# Patient Record
Sex: Male | Born: 1990 | Race: White | Hispanic: No | Marital: Married | Smoking: Never smoker
Health system: Southern US, Community
[De-identification: ages and names within clinical notes are randomized; demographics above are authoritative.]

## PROBLEM LIST (undated history)

## (undated) DIAGNOSIS — T7840XA Allergy, unspecified, initial encounter: Secondary | ICD-10-CM

## (undated) HISTORY — DX: Allergy, unspecified, initial encounter: T78.40XA

## (undated) HISTORY — PX: FRACTURE SURGERY: SHX138

---
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
## 2017-06-13 ENCOUNTER — Emergency Department
Admission: EM | Admit: 2017-06-13 | Discharge: 2017-06-13 | Disposition: A | Discharge status: Home / Self Care | Payer: 59 | Attending: Emergency Medicine | Admitting: Emergency Medicine

## 2017-06-13 ENCOUNTER — Emergency Department: Payer: 59

## 2017-06-13 ENCOUNTER — Encounter: Payer: Self-pay | Admitting: Emergency Medicine

## 2017-06-13 DIAGNOSIS — W109XXA Fall (on) (from) unspecified stairs and steps, initial encounter: Secondary | ICD-10-CM | POA: Diagnosis not present

## 2017-06-13 DIAGNOSIS — Y929 Unspecified place or not applicable: Secondary | ICD-10-CM | POA: Insufficient documentation

## 2017-06-13 DIAGNOSIS — S43005A Unspecified dislocation of left shoulder joint, initial encounter: Secondary | ICD-10-CM | POA: Diagnosis not present

## 2017-06-13 DIAGNOSIS — S4992XA Unspecified injury of left shoulder and upper arm, initial encounter: Secondary | ICD-10-CM | POA: Diagnosis not present

## 2017-06-13 DIAGNOSIS — S43015A Anterior dislocation of left humerus, initial encounter: Secondary | ICD-10-CM | POA: Diagnosis not present

## 2017-06-13 DIAGNOSIS — Y999 Unspecified external cause status: Secondary | ICD-10-CM | POA: Insufficient documentation

## 2017-06-13 DIAGNOSIS — Y939 Activity, unspecified: Secondary | ICD-10-CM | POA: Diagnosis not present

## 2017-06-13 MED ORDER — ONDANSETRON HCL 4 MG/2ML IJ SOLN
INTRAMUSCULAR | Status: AC
  Filled 2017-06-13: qty 2

## 2017-06-13 MED ORDER — ONDANSETRON HCL 4 MG/2ML IJ SOLN
4.0000 mg | Freq: Once | INTRAMUSCULAR | Status: AC
  Administered 2017-06-13: 4 mg via INTRAVENOUS

## 2017-06-13 MED ORDER — SODIUM CHLORIDE 0.9 % IV BOLUS (SEPSIS)
1000.0000 mL | Freq: Once | INTRAVENOUS | Status: AC
  Administered 2017-06-13: 1000 mL via INTRAVENOUS

## 2017-06-13 MED ORDER — ETOMIDATE 2 MG/ML IV SOLN
15.0000 mg | Freq: Once | INTRAVENOUS | Status: DC
  Filled 2017-06-13: qty 10

## 2017-06-13 MED ORDER — ETOMIDATE 2 MG/ML IV SOLN
INTRAVENOUS | Status: AC | PRN
  Administered 2017-06-13: 12 mg via INTRAVENOUS

## 2017-06-13 MED ORDER — HYDROMORPHONE HCL 1 MG/ML IJ SOLN
1.0000 mg | Freq: Once | INTRAMUSCULAR | Status: AC
  Administered 2017-06-13: 1 mg via INTRAVENOUS
  Filled 2017-06-13: qty 1

## 2017-06-13 NOTE — ED Provider Notes (Signed)
[facility] Emergency Department Provider Note ____________________________________________   I have reviewed the triage vital signs and the triage nursing note.  HISTORY  Chief Complaint Shoulder Injury   Historian Patient  HPI Jorge Andrews is a 26 y.o. male left shoulder pain and deformity after fall.  Moderate pain, at least 6 out of 10.  Patient has had multiple dislocations of the right shoulder, followed by surgery.  This is the second time he is dislocated to the left shoulder.  Movement makes it worse.  History reviewed. No pertinent past medical history.  There are no active problems to display for this patient.   History reviewed. No pertinent surgical history.  Prior to Admission medications   Not on File    Allergies  Allergen Reactions  . Codeine Hives    No family history on file.  Social History Social History  Substance Use Topics  . Smoking status: Never Smoker  . Smokeless tobacco: Not on file  . Alcohol use Not on file    Review of Systems  Constitutional: Negative for fever. Eyes: Negative for visual changes. ENT: Negative for sore throat. Cardiovascular: Negative for chest pain. Respiratory: Negative for shortness of breath. Gastrointestinal: Negative for abdominal pain, vomiting and diarrhea. Genitourinary: Negative for dysuria. Musculoskeletal: Negative for back pain.  Positive for left shoulder pain. Skin: Negative for rash. Neurological: Negative for headache.  ____________________________________________   PHYSICAL EXAM:  VITAL SIGNS: ED Triage Vitals  Enc Vitals Group     BP 06/13/17 1110 131/82     Pulse Rate 06/13/17 1110 72     Resp 06/13/17 1110 20     Temp 06/13/17 1110 98.2 F (36.8 C)     Temp Source 06/13/17 1110 Oral     SpO2 06/13/17 1110 98 %     Weight 06/13/17 1111 250 lb (113.4 kg)     Height 06/13/17 1111 6\' 2"  (1.88 m)     Head Circumference --      Peak Flow --      Pain  Score 06/13/17 1112 5     Pain Loc --      Pain Edu? --      Excl. in GC? --      Constitutional: Alert and oriented. Well appearing and in no distress. HEENT   Head: Normocephalic and atraumatic.      Eyes: Conjunctivae are normal. Pupils equal and round.       Ears:         Nose: No congestion/rhinnorhea.   Mouth/Throat: Mucous membranes are moist.   Neck: No stridor. Cardiovascular/Chest: Normal rate, regular rhythm.  No murmurs, rubs, or gallops. Respiratory: Normal respiratory effort without tachypnea nor retractions. Breath sounds are clear and equal bilaterally. No wheezes/rales/rhonchi. Gastrointestinal: Soft. No distention, no guarding, no rebound. Nontender.    Genitourinary/rectal:Deferred Musculoskeletal: Left shoulder deformity consistent with shoulder dislocation.  Pain at the left shoulder.  Axillary nerve sensation intact.  Neurovascularly intact.  No other extremity injuries.  Nontender with normal range of motion in all extremities. No joint effusions.  No lower extremity tenderness.  No edema. Neurologic:  Normal speech and language. No gross or focal neurologic deficits are appreciated. Skin:  Skin is warm, dry and intact. No rash noted. Psychiatric: Mood and affect are normal. Speech and behavior are normal. Patient exhibits appropriate insight and judgment.   ____________________________________________  LABS (pertinent positives/negatives) I, Governor Rooksebecca Noah Pelaez, MD the attending physician have reviewed the labs noted below.  Labs Reviewed -  No data to display  ____________________________________________    EKG I, Governor Rooks, MD, the attending physician have personally viewed and interpreted all ECGs.  None ____________________________________________  RADIOLOGY All Xrays were viewed by me.  Imaging interpreted by Radiologist, and I, Governor Rooks, MD the attending physician have reviewed the radiologist interpretation noted below.  Shoulder  left:  IMPRESSION: Anterior dislocation of the left humeral head.  Post-reduction left shoulder: IMPRESSION: Successful reduction of dislocation. __________________________________________  PROCEDURES  Procedure(s) performed: Reduction of dislocation Date/Time: 3:01 PM Performed by: Governor Rooks Authorized by: Governor Rooks Consent: Verbal consent obtained. Risks and benefits: risks, benefits and alternatives were discussed Consent given by: patient Required items: required blood products, implants, devices, and special equipment available Time out: Immediately prior to procedure a "time out" was called to verify the correct patient, procedure, equipment, support staff and site/side marked as required.  Patient sedated: with etomidate 12mg    Vitals: Vital signs were monitored during sedation. Patient tolerance: Patient tolerated the procedure well with no immediate complications. Joint: left shoulder Reduction technique: external rotation   Moderate/procedural sedation.  Indication left shoulder dislocation reduction. Risk and benefits discussed with patient as well as his wife and verbal and written consent obtained. Last meal yesterday. No prior medical issues with sedation.  He has had moderate sedation for prior shoulder dislocations. ASA category 1.  Mallampati 1.  Physical exam reviewed.  12 mg of etomidate given.  Patient tolerated well with no complications.  Patient given postprocedural sedation discharge instructions.  Critical Care performed: None  ____________________________________________  No current facility-administered medications on file prior to encounter.    No current outpatient prescriptions on file prior to encounter.    ____________________________________________  ED COURSE / ASSESSMENT AND PLAN  Pertinent labs & imaging results that were available during my care of the patient were reviewed by me and considered in my medical decision making  (see chart for details).   No additional traumatic injuries, x-ray confirmed anterior shoulder dislocation.  Attempted reduction with initially pain control, able to get adequate pain control.  After discussion of risk and benefit of moderate sedation, proceeded with procedural sedation with etomidate.  Patient tolerated well, minimal external rotation resulted in a clinical dislocation reduction.  Axillary nerve testing was normal before and after the procedure.  Post reduction without fracture complication.     CONSULTATIONS: None   Patient / Family / Caregiver informed of clinical course, medical decision-making process, and agree with plan.   I discussed return precautions, follow-up instructions, and discharge instructions with patient and/or family.  Discharge Instructions : You were treated for shoulder dislocation.  Please follow-up with your orthopedic surgeon or as referred to Dr. Ernest Pine.  Return to the emergency department immediately for any worsening or uncontrolled pain, recurrent dislocation, weakness, numbness, or any other symptoms concerning to you.  ___________________________________________   FINAL CLINICAL IMPRESSION(S) / ED DIAGNOSES   Final diagnoses:  Shoulder dislocation, left, initial encounter              Note: This dictation was prepared with Dragon dictation. Any transcriptional errors that result from this process are unintentional    Governor Rooks, MD 06/13/17 1501

## 2017-06-13 NOTE — ED Triage Notes (Signed)
L shoulder injury slipping on stairs 15 min ago, hx of dislocations same shoulder.

## 2017-06-13 NOTE — Discharge Instructions (Addendum)
You were treated for shoulder dislocation.  Please follow-up with your orthopedic surgeon or as referred to Dr. Ernest PineHooten.  Return to the emergency department immediately for any worsening or uncontrolled pain, recurrent dislocation, weakness, numbness, or any other symptoms concerning to you.

## 2017-12-14 ENCOUNTER — Ambulatory Visit (INDEPENDENT_AMBULATORY_CARE_PROVIDER_SITE_OTHER): Payer: 59 | Admitting: Physician Assistant

## 2017-12-14 ENCOUNTER — Encounter: Payer: Self-pay | Admitting: Physician Assistant

## 2017-12-14 ENCOUNTER — Other Ambulatory Visit: Payer: Self-pay

## 2017-12-14 VITALS — BP 120/90 | HR 79 | Ht 74.0 in | Wt 265.0 lb

## 2017-12-14 DIAGNOSIS — Z131 Encounter for screening for diabetes mellitus: Secondary | ICD-10-CM | POA: Diagnosis not present

## 2017-12-14 DIAGNOSIS — J302 Other seasonal allergic rhinitis: Secondary | ICD-10-CM

## 2017-12-14 DIAGNOSIS — Z833 Family history of diabetes mellitus: Secondary | ICD-10-CM

## 2017-12-14 DIAGNOSIS — Z0000 Encounter for general adult medical examination without abnormal findings: Secondary | ICD-10-CM | POA: Diagnosis not present

## 2017-12-14 DIAGNOSIS — Z13220 Encounter for screening for lipoid disorders: Secondary | ICD-10-CM | POA: Diagnosis not present

## 2017-12-14 DIAGNOSIS — Z136 Encounter for screening for cardiovascular disorders: Secondary | ICD-10-CM | POA: Diagnosis not present

## 2017-12-14 DIAGNOSIS — Z7689 Persons encountering health services in other specified circumstances: Secondary | ICD-10-CM | POA: Diagnosis not present

## 2017-12-14 DIAGNOSIS — Z6834 Body mass index (BMI) 34.0-34.9, adult: Secondary | ICD-10-CM | POA: Diagnosis not present

## 2017-12-14 NOTE — Patient Instructions (Signed)

## 2017-12-14 NOTE — Progress Notes (Signed)
Patient: Jorge Andrews, Male    DOB: Sep 11, 1990, 27 y.o.   MRN: [id number] Visit Date: 12/14/2017  Today's Provider: Margaretann Loveless, PA-C   Chief Complaint  Patient presents with  . Establish Care  . Annual Exam   Subjective:    Annual physical exam Jorge Andrews is a 27 y.o. male who presents today for health maintenance and complete physical. He feels well. He reports exercising 2-3 days a week for an hour or so. He reports he is sleeping fairly well.  He is an Charity fundraiser at [employer]. He is married. His wife works as an Charity fundraiser for labor and delivery and is studying to become a Immunologist. They have 1 daughter that is 15 months and are expecting a second child in August (gender unknown). He and his wife moved here 3 years ago from [place].  -----------------------------------------------------------------  Review of Systems  Constitutional: Negative.   HENT: Positive for congestion and sneezing.   Eyes: Negative.   Respiratory: Negative.   Cardiovascular: Negative.   Gastrointestinal: Negative.   Endocrine: Negative.   Genitourinary: Negative.   Musculoskeletal: Negative.   Skin: Negative.   Allergic/Immunologic: Positive for environmental allergies.  Neurological: Negative.   Hematological: Negative.   Psychiatric/Behavioral: Negative.     Social History      He  reports that he has never smoked. He has never used smokeless tobacco. He reports that he drinks about 1.2 oz of alcohol per week. He reports that he does not use drugs.       Social History   Socioeconomic History  . Marital status: Married    Spouse name: Not on file  . Number of children: 1  . Years of education: Not on file  . Highest education level: Not on file  Occupational History  . Not on file  Social Needs  . Financial resource strain: Not on file  . Food insecurity:    Worry: Not on file    Inability: Not on file  . Transportation needs:    Medical: Not on file    Non-medical: Not on file    Tobacco Use  . Smoking status: Never Smoker  . Smokeless tobacco: Never Used  Substance and Sexual Activity  . Alcohol use: Yes    Alcohol/week: 1.2 oz    Types: 1 Glasses of wine, 1 Cans of beer per week  . Drug use: Never  . Sexual activity: Yes    Partners: Female  Lifestyle  . Physical activity:    Days per week: 2 days    Minutes per session: 60 min  . Stress: Not on file  Relationships  . Social connections:    Talks on phone: Not on file    Gets together: Not on file    Attends religious service: Not on file    Active member of club or organization: Not on file    Attends meetings of clubs or organizations: Not on file    Relationship status: Not on file  Other Topics Concern  . Not on file  Social History Narrative  . Not on file    Past Medical History:  Diagnosis Date  . Allergy     There are no active problems to display for this patient.   Past Surgical History:  Procedure Laterality Date  . FRACTURE SURGERY    . SHOULDER SURGERY Right 09/2008    Family History        Family Status  Relation Name Status  . Mother  (Not Specified)  . MGM  (Not Specified)  . MGF  (Not Specified)  . PGM  (Not Specified)  . PGF  (Not Specified)        His family history includes Atrial fibrillation in his maternal grandmother; Diabetes in his paternal grandmother; Heart murmur in his mother; Hypertension in his maternal grandfather; Stroke in his paternal grandfather.      Allergies  Allergen Reactions  . Codeine Hives    No current outpatient medications on file.   Patient Care Team: Margaretann Loveless, PA-C as PCP - General (Family Medicine)      Objective:   Vitals: BP 120/90 (BP Location: Left Arm, Patient Position: Sitting, Cuff Size: Large)   Pulse 79   Ht  (1.88 m)   Wt 265 lb (120.2 kg)   SpO2 96%   BMI 34.02 kg/m    Vitals:   12/14/17 1028  BP: 120/90  Pulse: 79  SpO2: 96%  Weight: 265 lb (120.2 kg)  Height:  (1.88 m)      Physical Exam  Constitutional: He is oriented to person, place, and time. He appears well-developed and well-nourished.  HENT:  Head: Normocephalic and atraumatic.  Right Ear: Hearing, tympanic membrane, external ear and ear canal normal.  Left Ear: Hearing, tympanic membrane, external ear and ear canal normal.  Nose: Nose normal.  Mouth/Throat: Uvula is midline, oropharynx is clear and moist and mucous membranes are normal.  Eyes: Pupils are equal, round, and reactive to light. Conjunctivae and EOM are normal. Right eye exhibits no discharge.  Neck: Normal range of motion. Neck supple. No tracheal deviation present. No thyromegaly present.  Cardiovascular: Normal rate, regular rhythm, normal heart sounds and intact distal pulses.  No murmur heard. Pulmonary/Chest: Effort normal and breath sounds normal. No respiratory distress. He has no wheezes. He has no rales. He exhibits no tenderness.  Abdominal: Soft. He exhibits no distension and no mass. There is no tenderness. There is no rebound and no guarding.  Musculoskeletal: Normal range of motion. He exhibits no edema or tenderness.  Lymphadenopathy:    He has no cervical adenopathy.  Neurological: He is alert and oriented to person, place, and time. He has normal reflexes. He displays normal reflexes. No cranial nerve deficit. He exhibits normal muscle tone. Coordination normal.  Skin: Skin is warm and dry. No rash noted. No erythema.  Psychiatric: He has a normal mood and affect. His behavior is normal. Judgment and thought content normal.  Vitals reviewed.    Depression Screen PHQ 2/9 Scores 12/14/2017  PHQ - 2 Score 0      Assessment & Plan:     Routine Health Maintenance and Physical Exam  Exercise Activities and Dietary recommendations Goals    None       There is no immunization history on file for this patient.  Health Maintenance  Topic Date Due  . HIV Screening  06/27/2007  . TETANUS/TDAP  06/27/2011  .  INFLUENZA VACCINE  03/14/2018     Discussed health benefits of physical activity, and encouraged him to engage in regular exercise appropriate for his age and condition.    1. Encounter to establish care No previous PCP since 3 years ago when he left [place].   2. Annual physical exam Normal physical exam today. Will check labs as below and f/u pending lab results. If labs are stable and WNL she will not need to have these rechecked for  one year at her next annual physical exam. She is to call the office in the meantime if she has any acute issue, questions or concerns. - CBC w/Diff/Platelet - Comprehensive Metabolic Panel (CMET) - TSH - HgB A1c - Lipid Profile  3. BMI 34.0-34.9,adult Counseled patient on healthy lifestyle modifications including dieting and exercise.  - CBC w/Diff/Platelet - Comprehensive Metabolic Panel (CMET) - TSH - HgB A1c - Lipid Profile  4. Seasonal allergies No treatment required at this time.   5. Family history of diabetes mellitus Paternal grandmother. Will check labs as below and f/u pending results. - HgB A1c  6. Diabetes mellitus screening Will check labs as below and f/u pending results. - HgB A1c  7. Encounter for lipid screening for cardiovascular disease Will check labs as below and f/u pending results. - Lipid Profile  --------------------------------------------------------------------    Margaretann Loveless, PA-C  [facility]

## 2017-12-15 LAB — CBC WITH DIFFERENTIAL/PLATELET
Basophils Absolute: 0 10*3/uL (ref 0.0–0.2)
Basos: 0 %
EOS (ABSOLUTE): 0.1 10*3/uL (ref 0.0–0.4)
EOS: 2 %
Hematocrit: 44.6 % (ref 37.5–51.0)
Hemoglobin: 15.1 g/dL (ref 13.0–17.7)
IMMATURE GRANS (ABS): 0 10*3/uL (ref 0.0–0.1)
IMMATURE GRANULOCYTES: 0 %
Lymphocytes Absolute: 1.6 10*3/uL (ref 0.7–3.1)
Lymphs: 28 %
MCH: 28 pg (ref 26.6–33.0)
MCHC: 33.9 g/dL (ref 31.5–35.7)
MCV: 83 fL (ref 79–97)
MONOS ABS: 0.5 10*3/uL (ref 0.1–0.9)
Monocytes: 10 %
NEUTROS PCT: 60 %
Neutrophils Absolute: 3.3 10*3/uL (ref 1.4–7.0)
Platelets: 199 10*3/uL (ref 150–379)
RBC: 5.4 x10E6/uL (ref 4.14–5.80)
RDW: 13.3 % (ref 12.3–15.4)
WBC: 5.6 10*3/uL (ref 3.4–10.8)

## 2017-12-15 LAB — COMPREHENSIVE METABOLIC PANEL
ALBUMIN: 4.7 g/dL (ref 3.5–5.5)
ALT: 25 IU/L (ref 0–44)
AST: 22 IU/L (ref 0–40)
Albumin/Globulin Ratio: 2.1 (ref 1.2–2.2)
Alkaline Phosphatase: 66 IU/L (ref 39–117)
BUN / CREAT RATIO: 11 (ref 9–20)
BUN: 11 mg/dL (ref 6–20)
Bilirubin Total: 0.8 mg/dL (ref 0.0–1.2)
CALCIUM: 9.5 mg/dL (ref 8.7–10.2)
CO2: 24 mmol/L (ref 20–29)
CREATININE: 1 mg/dL (ref 0.76–1.27)
Chloride: 105 mmol/L (ref 96–106)
GFR calc non Af Amer: 104 mL/min/{1.73_m2} (ref >59)
GFR, EST AFRICAN AMERICAN: 120 mL/min/{1.73_m2} (ref >59)
Globulin, Total: 2.2 g/dL (ref 1.5–4.5)
Glucose: 84 mg/dL (ref 65–99)
Potassium: 4 mmol/L (ref 3.5–5.2)
Sodium: 143 mmol/L (ref 134–144)
TOTAL PROTEIN: 6.9 g/dL (ref 6.0–8.5)

## 2017-12-15 LAB — LIPID PANEL
CHOLESTEROL TOTAL: 166 mg/dL (ref 100–199)
Chol/HDL Ratio: 4.5 ratio (ref 0.0–5.0)
HDL: 37 mg/dL — AB (ref >39)
LDL CALC: 119 mg/dL — AB (ref 0–99)
TRIGLYCERIDES: 50 mg/dL (ref 0–149)
VLDL CHOLESTEROL CAL: 10 mg/dL (ref 5–40)

## 2017-12-15 LAB — HEMOGLOBIN A1C
ESTIMATED AVERAGE GLUCOSE: 100 mg/dL
Hgb A1c MFr Bld: 5.1 % (ref 4.8–5.6)

## 2017-12-15 LAB — TSH: TSH: 1.47 u[IU]/mL (ref 0.450–4.500)

## 2018-04-22 IMAGING — CR DG SHOULDER 2+V*L*
1 series · 2 of 2 positions shown · non-contrast
Comparison: None.

CLINICAL DATA: Anterior dislocation after a fall down stairs today.

EXAM:
LEFT SHOULDER - 2+ VIEW

[Series 1: dg shoulder left · 0.14mm/px · 2 of 2 slices shown]
[im 1/2]
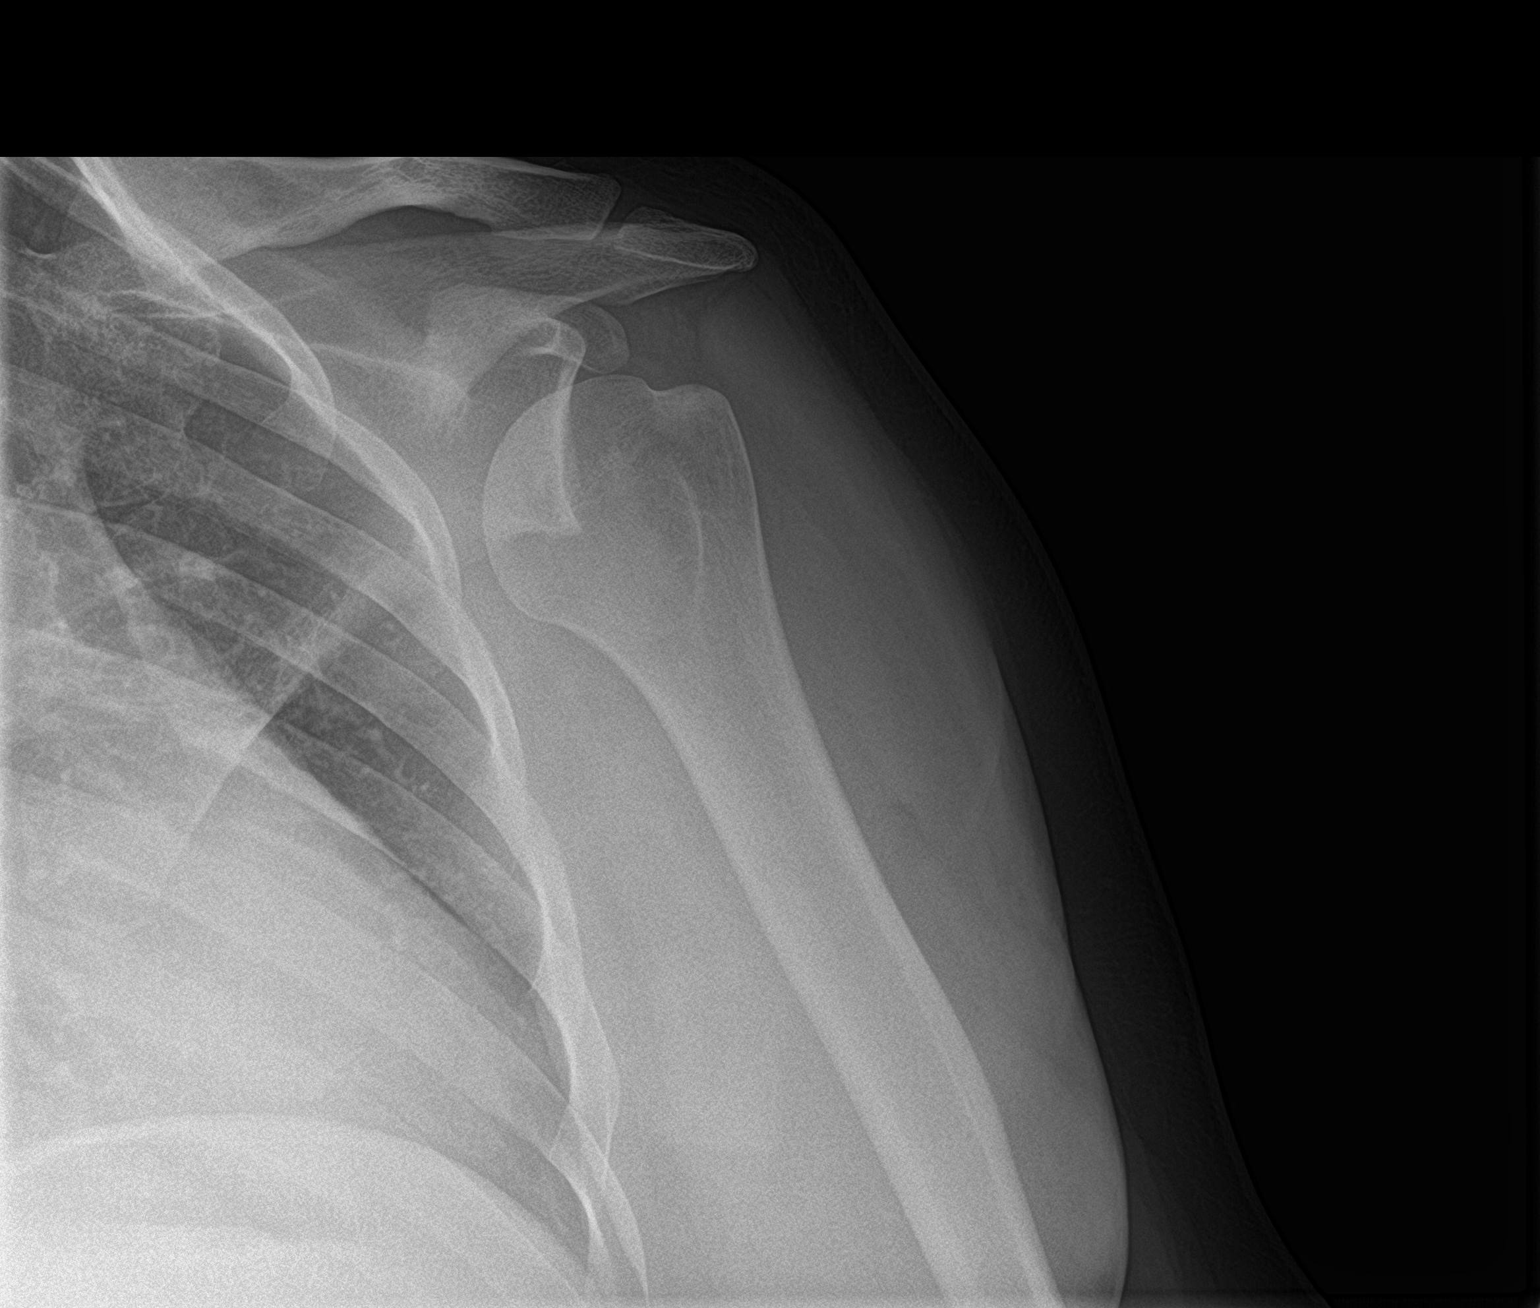
[im 2/2]
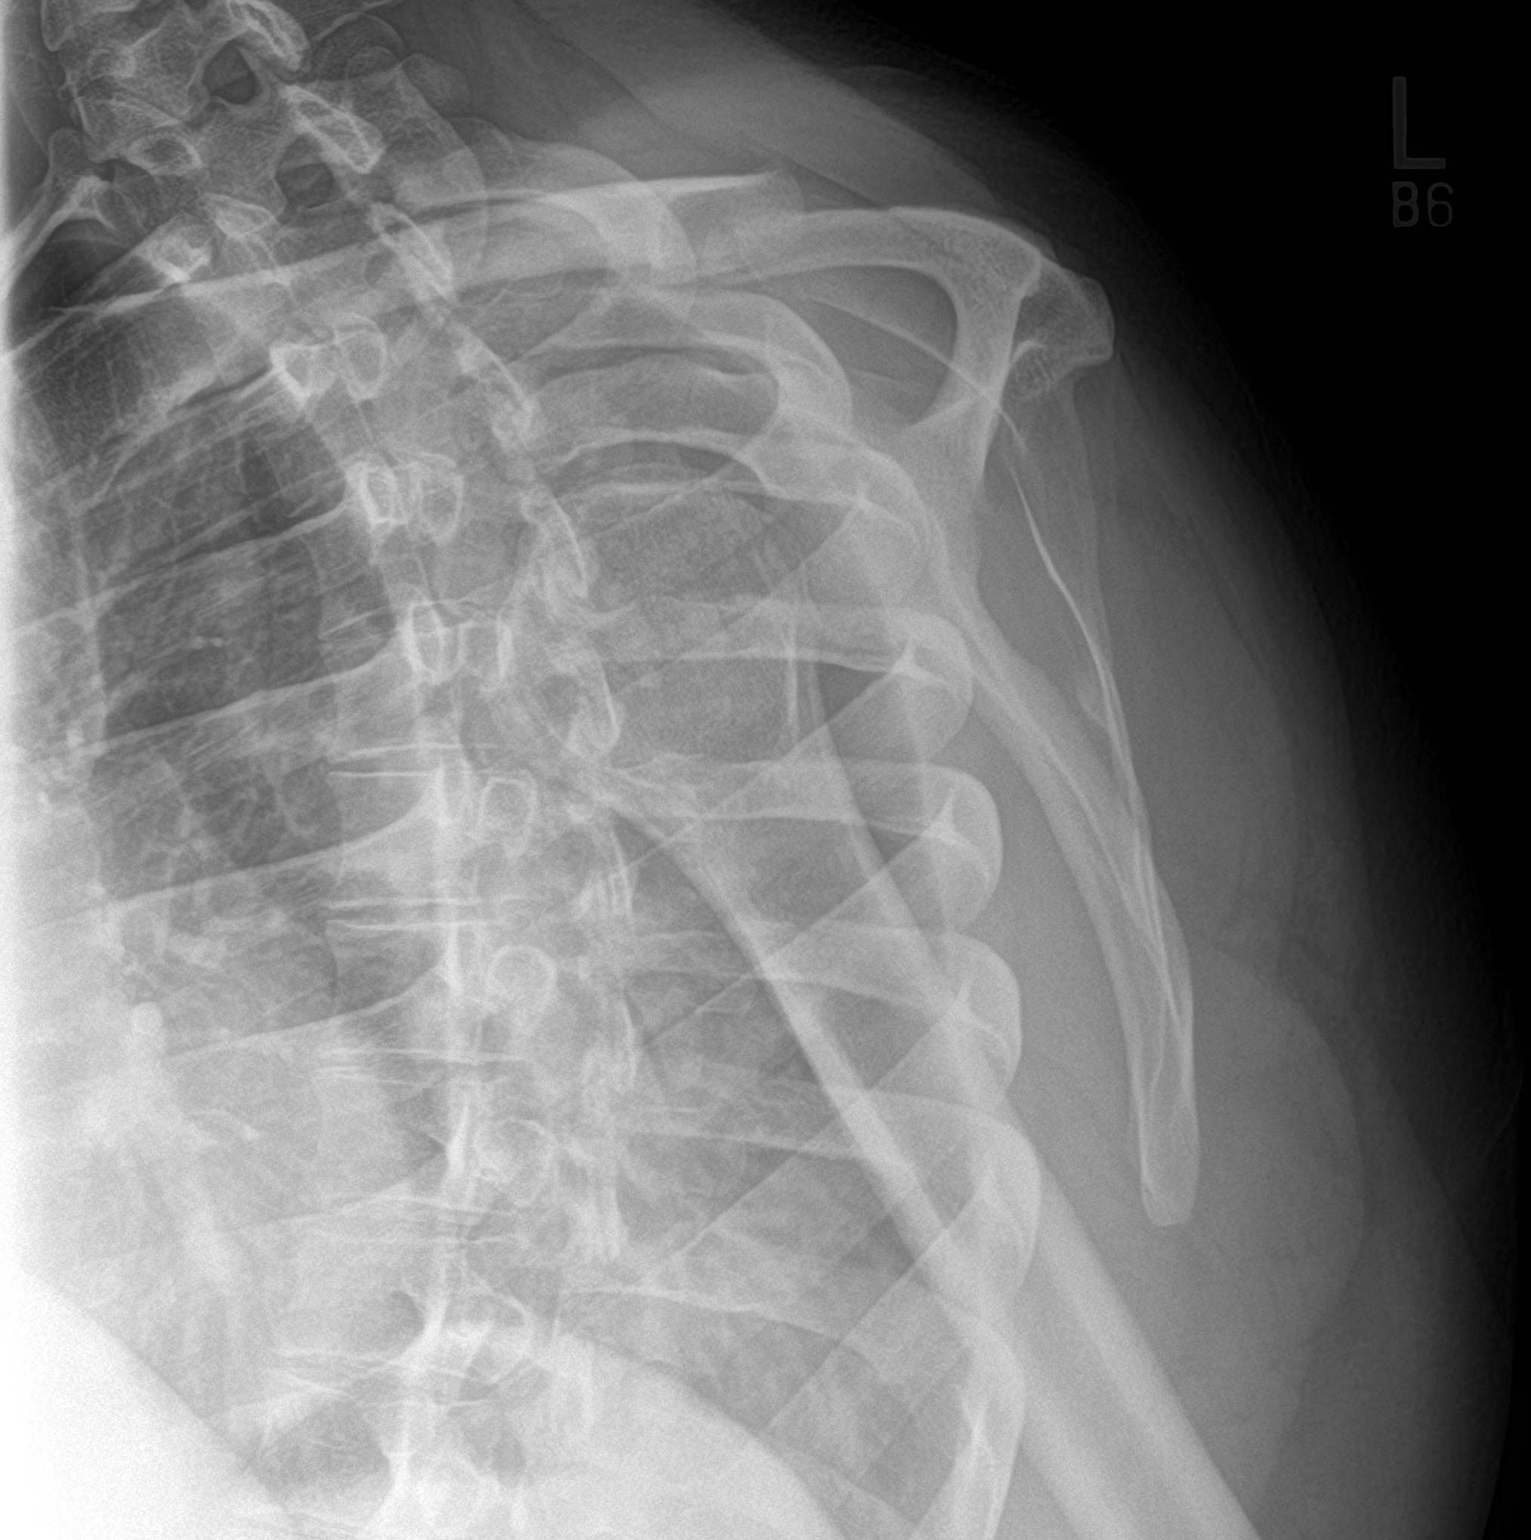

[2 of 2 positions shown; findings below may reference images not displayed]

FINDINGS: There is anterior dislocation of the left humeral head. No discrete
fracture.
IMPRESSION: Anterior dislocation of the left humeral head.

## 2020-04-12 ENCOUNTER — Other Ambulatory Visit: Payer: Self-pay

## 2020-04-12 ENCOUNTER — Ambulatory Visit (INDEPENDENT_AMBULATORY_CARE_PROVIDER_SITE_OTHER): Payer: 59 | Admitting: Physician Assistant

## 2020-04-12 ENCOUNTER — Other Ambulatory Visit: Payer: Self-pay | Admitting: Physician Assistant

## 2020-04-12 ENCOUNTER — Encounter: Payer: Self-pay | Admitting: Physician Assistant

## 2020-04-12 VITALS — BP 119/78 | HR 67 | Temp 97.9°F | Resp 16 | Ht 73.0 in | Wt 264.2 lb

## 2020-04-12 DIAGNOSIS — E6609 Other obesity due to excess calories: Secondary | ICD-10-CM | POA: Diagnosis not present

## 2020-04-12 DIAGNOSIS — Z1159 Encounter for screening for other viral diseases: Secondary | ICD-10-CM | POA: Diagnosis not present

## 2020-04-12 DIAGNOSIS — Z114 Encounter for screening for human immunodeficiency virus [HIV]: Secondary | ICD-10-CM

## 2020-04-12 DIAGNOSIS — Z6834 Body mass index (BMI) 34.0-34.9, adult: Secondary | ICD-10-CM

## 2020-04-12 DIAGNOSIS — Z0000 Encounter for general adult medical examination without abnormal findings: Secondary | ICD-10-CM | POA: Diagnosis not present

## 2020-04-12 NOTE — Progress Notes (Signed)
Complete physical exam   Patient: Jorge Andrews   DOB: 1991/03/07   28 y.o. Male  MRN: [id number] Visit Date: 04/12/2020  Today's healthcare provider: Margaretann LovelessJennifer M Demetrias Goodbar, PA-C   Chief Complaint  Patient presents with   Annual Exam   Subjective    Jorge Andrews is a 29 y.o. male who presents today for a complete physical exam.  He reports consuming a general diet. Gym/ health club routine includes cardio and light weights. He generally feels well. He reports sleeping fairly well. He does not have additional problems to discuss today.  HPI   Past Medical History:  Diagnosis Date   Allergy    Past Surgical History:  Procedure Laterality Date   FRACTURE SURGERY     SHOULDER SURGERY Right 09/2008   Social History   Socioeconomic History   Marital status: Married    Spouse name: Not on file   Number of children: 1   Years of education: Not on file   Highest education level: Not on file  Occupational History   Not on file  Tobacco Use   Smoking status: Never Smoker   Smokeless tobacco: Never Used  Substance and Sexual Activity   Alcohol use: Yes    Alcohol/week: 2.0 standard drinks    Types: 1 Glasses of wine, 1 Cans of beer per week   Drug use: Never   Sexual activity: Yes    Partners: Female  Other Topics Concern   Not on file  Social History Narrative   Not on file   Social Determinants of Health   Financial Resource Strain:    Difficulty of Paying Living Expenses: Not on file  Food Insecurity:    Worried About Programme researcher, broadcasting/film/videounning Out of Food in the Last Year: Not on file   [employer] Out of Food in the Last Year: Not on file  Transportation Needs:    Lack of Transportation (Medical): Not on file   Lack of Transportation (Non-Medical): Not on file  Physical Activity:    Days of Exercise per Week: Not on file   Minutes of Exercise per Session: Not on file  Stress:    Feeling of Stress : Not on file  Social Connections:    Frequency of  Communication with Friends and Family: Not on file   Frequency of Social Gatherings with Friends and Family: Not on file   Attends Religious Services: Not on file   Active Member of Clubs or Organizations: Not on file   Attends BankerClub or Organization Meetings: Not on file   Marital Status: Not on file  Intimate Partner Violence:    Fear of Current or Ex-Partner: Not on file   Emotionally Abused: Not on file   Physically Abused: Not on file   Sexually Abused: Not on file   Family Status  Relation Name Status   Mother  (Not Specified)   MGM  (Not Specified)   MGF  (Not Specified)   PGM  (Not Specified)   PGF  (Not Specified)   Family History  Problem Relation Age of Onset   Heart murmur Mother    Atrial fibrillation Maternal Grandmother    Hypertension Maternal Grandfather    Diabetes Paternal Grandmother    Stroke Paternal Grandfather    Allergies  Allergen Reactions   Codeine Hives    Patient Care Team: Reine JustBurnette, Alando Colleran M, PA-C as PCP - General (Family Medicine)   Medications: No outpatient medications prior to visit.   No facility-administered  medications prior to visit.    Review of Systems  Constitutional: Negative.   HENT: Negative.   Eyes: Negative.   Respiratory: Negative.   Cardiovascular: Negative.   Gastrointestinal: Negative.   Endocrine: Negative.   Genitourinary: Negative.   Musculoskeletal: Negative.   Skin: Negative.   Allergic/Immunologic: Negative.   Neurological: Negative.   Hematological: Negative.   Psychiatric/Behavioral: Negative.     Last CBC Lab Results  Component Value Date   WBC 5.8 04/12/2020   HGB 15.8 04/12/2020   HCT 46.8 04/12/2020   MCV 83 04/12/2020   MCH 27.9 04/12/2020   RDW 13.1 04/12/2020   PLT 182 04/12/2020   Last metabolic panel Lab Results  Component Value Date   GLUCOSE 87 04/12/2020   NA 142 04/12/2020   K 4.2 04/12/2020   CL 104 04/12/2020   CO2 24 04/12/2020   BUN 12 04/12/2020    CREATININE 0.97 04/12/2020   GFRNONAA 107 04/12/2020   GFRAA 123 04/12/2020   CALCIUM 9.7 04/12/2020   PROT 6.9 04/12/2020   ALBUMIN 4.8 04/12/2020   LABGLOB 2.1 04/12/2020   AGRATIO 2.3 (H) 04/12/2020   BILITOT 0.6 04/12/2020   ALKPHOS 76 04/12/2020   AST 21 04/12/2020   ALT 25 04/12/2020      Objective    BP 119/78 (BP Location: Left Arm, Patient Position: Sitting, Cuff Size: Large)    Pulse 67    Temp 97.9 F (36.6 C) (Oral)    Resp 16    Ht 6\' 1"  (1.854 m)    Wt 264 lb 3.2 oz (119.8 kg)    BMI 34.86 kg/m  BP Readings from Last 3 Encounters:  04/12/20 119/78  12/14/17 120/90  06/13/17 111/73   Wt Readings from Last 3 Encounters:  04/12/20 264 lb 3.2 oz (119.8 kg)  12/14/17 265 lb (120.2 kg)  06/13/17 250 lb (113.4 kg)      Physical Exam Vitals reviewed.  Constitutional:      General: He is not in acute distress.    Appearance: Normal appearance. He is well-developed. He is obese. He is not ill-appearing.  HENT:     Head: Normocephalic and atraumatic.     Right Ear: Tympanic membrane, ear canal and external ear normal.     Left Ear: Tympanic membrane, ear canal and external ear normal.     Nose: Nose normal.     Mouth/Throat:     Mouth: Mucous membranes are moist.     Pharynx: Oropharynx is clear. No posterior oropharyngeal erythema.  Eyes:     General: No scleral icterus.       Right eye: No discharge.        Left eye: No discharge.     Extraocular Movements: Extraocular movements intact.     Conjunctiva/sclera: Conjunctivae normal.     Pupils: Pupils are equal, round, and reactive to light.  Neck:     Thyroid: No thyromegaly.     Trachea: No tracheal deviation.  Cardiovascular:     Rate and Rhythm: Normal rate and regular rhythm.     Pulses: Normal pulses.     Heart sounds: Normal heart sounds. No murmur heard.   Pulmonary:     Effort: Pulmonary effort is normal. No respiratory distress.     Breath sounds: Normal breath sounds. No wheezing or rales.   Chest:     Chest wall: No tenderness.  Abdominal:     General: Abdomen is flat. Bowel sounds are normal. There is no distension.  Palpations: Abdomen is soft. There is no mass.     Tenderness: There is no abdominal tenderness. There is no guarding or rebound.  Musculoskeletal:        General: No tenderness. Normal range of motion.     Cervical back: Normal range of motion and neck supple.     Right lower leg: No edema.     Left lower leg: No edema.  Lymphadenopathy:     Cervical: No cervical adenopathy.  Skin:    General: Skin is warm and dry.     Capillary Refill: Capillary refill takes less than 2 seconds.     Findings: No erythema or rash.  Neurological:     General: No focal deficit present.     Mental Status: He is alert and oriented to person, place, and time. Mental status is at baseline.     Cranial Nerves: No cranial nerve deficit.     Motor: No abnormal muscle tone.     Coordination: Coordination normal.     Deep Tendon Reflexes: Reflexes are normal and symmetric. Reflexes normal.  Psychiatric:        Mood and Affect: Mood normal.        Behavior: Behavior normal.        Thought Content: Thought content normal.        Judgment: Judgment normal.     Last depression screening scores PHQ 2/9 Scores 04/12/2020 04/12/2020 12/14/2017  PHQ - 2 Score 0 0 0   Last fall risk screening Fall Risk  04/12/2020  Falls in the past year? 0  Number falls in past yr: 0  Injury with Fall? 0  Comment -  Risk for fall due to : No Fall Risks  Follow up Falls evaluation completed   Last Audit-C alcohol use screening Alcohol Use Disorder Test (AUDIT) 04/12/2020  1. How often do you have a drink containing alcohol? 2  2. How many drinks containing alcohol do you have on a typical day when you are drinking? 0  3. How often do you have six or more drinks on one occasion? 0  AUDIT-C Score 2  Alcohol Brief Interventions/Follow-up AUDIT Score <7 follow-up not indicated   A score of 3  or more in women, and 4 or more in men indicates increased risk for alcohol abuse, EXCEPT if all of the points are from question 1   No results found for any visits on 04/12/20.  Assessment & Plan    Routine Health Maintenance and Physical Exam  Exercise Activities and Dietary recommendations Goals   None      There is no immunization history on file for this patient.  Health Maintenance  Topic Date Due   Hepatitis C Screening  Never done   COVID-19 Vaccine (1) Never done   HIV Screening  Never done   TETANUS/TDAP  Never done   INFLUENZA VACCINE  11/11/2020 (Originally 03/14/2020)    Discussed health benefits of physical activity, and encouraged him to engage in regular exercise appropriate for his age and condition.  1. Annual physical exam Normal physical exam today. Will check labs as below and f/u pending lab results. If labs are stable and WNL he will not need to have these rechecked for one year at his next annual physical exam. He is to call the office in the meantime if he has any acute issue, questions or concerns. - CBC w/Diff/Platelet - Comprehensive Metabolic Panel (CMET) - Lipid Panel With LDL/HDL Ratio - HgB A1c -  TSH  2. Class 1 obesity due to excess calories with serious comorbidity and body mass index (BMI) of 34.0 to 34.9 in adult Counseled patient on healthy lifestyle modifications including dieting and exercise.  - CBC w/Diff/Platelet - Comprehensive Metabolic Panel (CMET) - Lipid Panel With LDL/HDL Ratio - HgB A1c - TSH  3. Encounter for hepatitis C screening test for low risk patient Will check labs as below and f/u pending results. - Hepatitis C Antibody  4. Screening for HIV without presence of risk factors Will check labs as below and f/u pending results. - HIV antibody (with reflex)  No follow-ups on file.     Delmer Islam, PA-C, have reviewed all documentation for this visit. The documentation on 04/16/20 for the exam,  diagnosis, procedures, and orders are all accurate and complete.   Reine Just  [facility] [phone] (phone) [phone] (fax)  [facility]

## 2020-04-12 NOTE — Patient Instructions (Signed)

## 2020-04-13 ENCOUNTER — Telehealth: Payer: Self-pay

## 2020-04-13 LAB — CBC WITH DIFFERENTIAL/PLATELET
Basophils Absolute: 0 10*3/uL (ref 0.0–0.2)
Basos: 1 %
EOS (ABSOLUTE): 0.2 10*3/uL (ref 0.0–0.4)
Eos: 3 %
Hematocrit: 46.8 % (ref 37.5–51.0)
Hemoglobin: 15.8 g/dL (ref 13.0–17.7)
Immature Grans (Abs): 0 10*3/uL (ref 0.0–0.1)
Immature Granulocytes: 0 %
Lymphocytes Absolute: 1.6 10*3/uL (ref 0.7–3.1)
Lymphs: 28 %
MCH: 27.9 pg (ref 26.6–33.0)
MCHC: 33.8 g/dL (ref 31.5–35.7)
MCV: 83 fL (ref 79–97)
Monocytes Absolute: 0.5 10*3/uL (ref 0.1–0.9)
Monocytes: 8 %
Neutrophils Absolute: 3.5 10*3/uL (ref 1.4–7.0)
Neutrophils: 60 %
Platelets: 182 10*3/uL (ref 150–450)
RBC: 5.67 x10E6/uL (ref 4.14–5.80)
RDW: 13.1 % (ref 11.6–15.4)
WBC: 5.8 10*3/uL (ref 3.4–10.8)

## 2020-04-13 LAB — LIPID PANEL WITH LDL/HDL RATIO
Cholesterol, Total: 157 mg/dL (ref 100–199)
HDL: 38 mg/dL — ABNORMAL LOW (ref >39)
LDL Chol Calc (NIH): 101 mg/dL — ABNORMAL HIGH (ref 0–99)
LDL/HDL Ratio: 2.7 ratio (ref 0.0–3.6)
Triglycerides: 94 mg/dL (ref 0–149)
VLDL Cholesterol Cal: 18 mg/dL (ref 5–40)

## 2020-04-13 LAB — HEMOGLOBIN A1C
Est. average glucose Bld gHb Est-mCnc: 100 mg/dL
Hgb A1c MFr Bld: 5.1 % (ref 4.8–5.6)

## 2020-04-13 LAB — COMPREHENSIVE METABOLIC PANEL
ALT: 25 IU/L (ref 0–44)
AST: 21 IU/L (ref 0–40)
Albumin/Globulin Ratio: 2.3 — ABNORMAL HIGH (ref 1.2–2.2)
Albumin: 4.8 g/dL (ref 4.1–5.2)
Alkaline Phosphatase: 76 IU/L (ref 48–121)
BUN/Creatinine Ratio: 12 (ref 9–20)
BUN: 12 mg/dL (ref 6–20)
Bilirubin Total: 0.6 mg/dL (ref 0.0–1.2)
CO2: 24 mmol/L (ref 20–29)
Calcium: 9.7 mg/dL (ref 8.7–10.2)
Chloride: 104 mmol/L (ref 96–106)
Creatinine, Ser: 0.97 mg/dL (ref 0.76–1.27)
GFR calc Af Amer: 123 mL/min/{1.73_m2} (ref >59)
GFR calc non Af Amer: 107 mL/min/{1.73_m2} (ref >59)
Globulin, Total: 2.1 g/dL (ref 1.5–4.5)
Glucose: 87 mg/dL (ref 65–99)
Potassium: 4.2 mmol/L (ref 3.5–5.2)
Sodium: 142 mmol/L (ref 134–144)
Total Protein: 6.9 g/dL (ref 6.0–8.5)

## 2020-04-13 LAB — TSH: TSH: 1.2 u[IU]/mL (ref 0.450–4.500)

## 2020-04-13 LAB — HEPATITIS C ANTIBODY: Hep C Virus Ab: <0.1 s/co ratio (ref 0.0–0.9)

## 2020-04-13 LAB — HIV ANTIBODY (ROUTINE TESTING W REFLEX): HIV Screen 4th Generation wRfx: NONREACTIVE

## 2020-04-13 NOTE — Telephone Encounter (Signed)
LMTCB-if patient calls back ok for [facility] nurse to give results °

## 2020-04-13 NOTE — Telephone Encounter (Signed)
Results viewed in MyChart on 04/13/20 at 10:28 am.

## 2020-04-13 NOTE — Telephone Encounter (Signed)
-----   Message from Margaretann Loveless, [place] sent at 04/13/2020  9:43 AM EDT ----- Blood count is normal. Kidney and liver function are normal. Sodium, potassium, and calcium are normal. Cholesterol is normal. A1c/sugar is normal. Thyroid is normal. HIV screen done once in a lifetime, unless exposed, is negative. Hepatitis C screen, also done once in a lifetime unless exposed, is also negative.
